# Patient Record
Sex: Male | Born: 1950 | Race: White | Hispanic: No | Marital: Married | Smoking: Never smoker
Health system: Southern US, Community
[De-identification: ages and names within clinical notes are randomized; demographics above are authoritative.]

## PROBLEM LIST (undated history)

## (undated) DIAGNOSIS — L570 Actinic keratosis: Secondary | ICD-10-CM

---
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
## 2009-12-11 ENCOUNTER — Ambulatory Visit: Payer: Self-pay | Admitting: Gastroenterology

## 2010-03-27 ENCOUNTER — Ambulatory Visit: Payer: Self-pay | Admitting: Gastroenterology

## 2010-03-29 LAB — PATHOLOGY REPORT

## 2011-10-18 IMAGING — RF DG BARIUM SWALLOW
1 series · 15 of 24 positions shown · non-contrast
Comparison: none

REASON FOR EXAM: WITH TABLET DYSPAHGIA
COMMENTS:

PROCEDURE:     FL  - FL BARIUM SWALLOW  - December 11, 2009  [DATE]
RESULT:
TECHNIQUE: Barium swallow exam was performed status post administration of
oral barium followed by administration of effervescent crystals and
re-administration of oral barium.

[Series 1: run · 14 acquisitions, 15 frames shown]
[im 1/14]
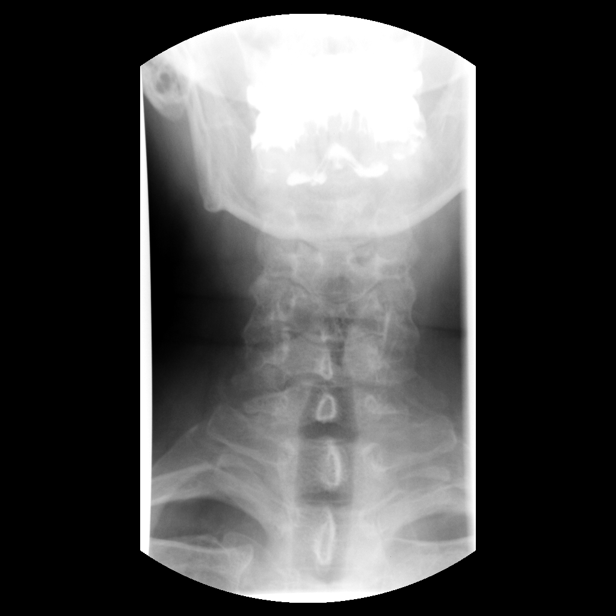
[im 1/14]
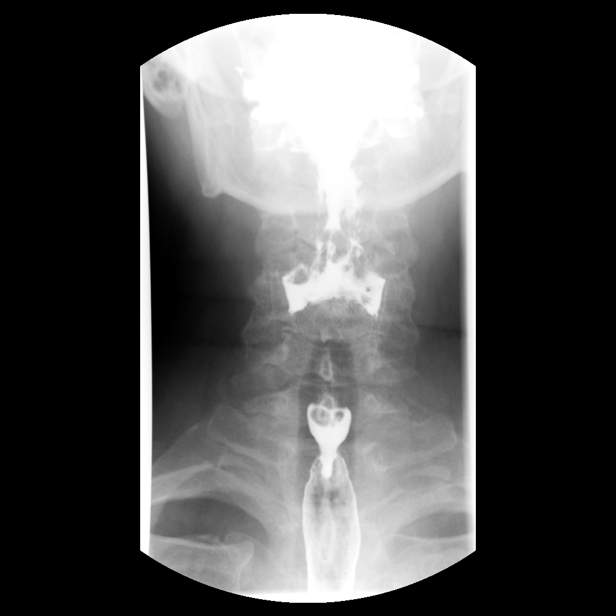
[im 2/14]
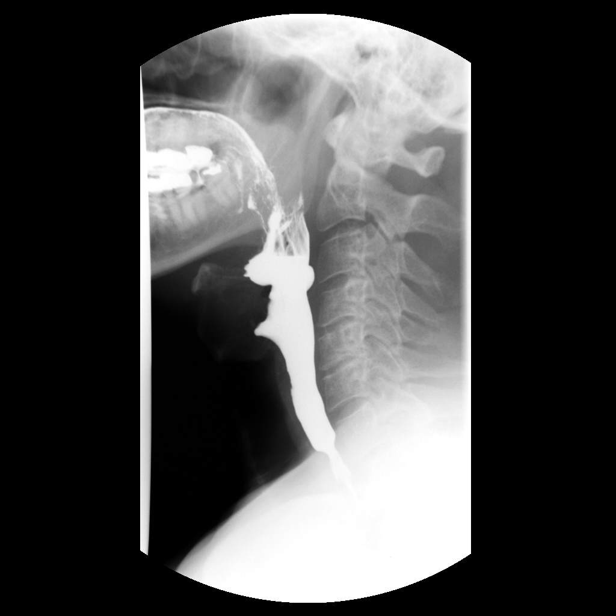
[im 2/14]
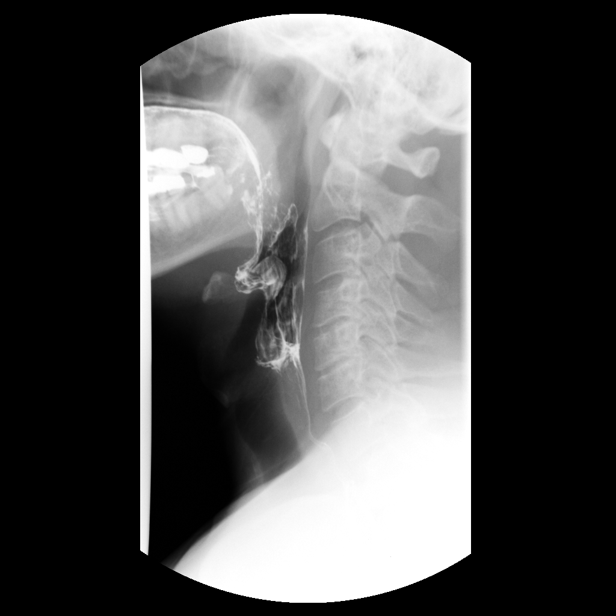
[im 3/14]
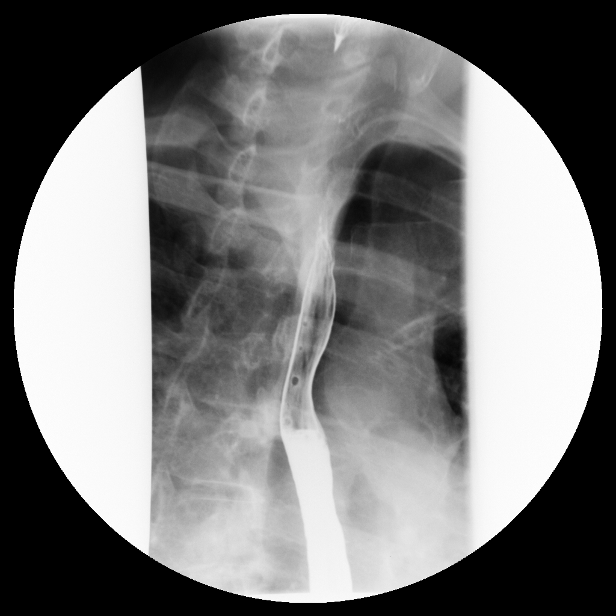
[im 3/14]
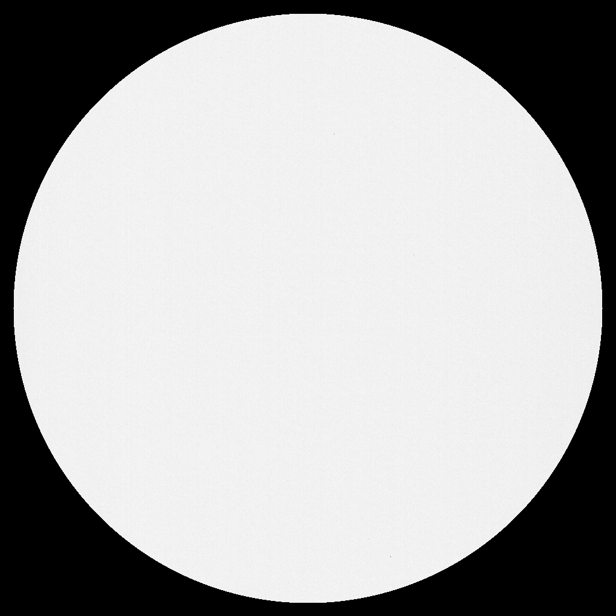
[im 4/14]
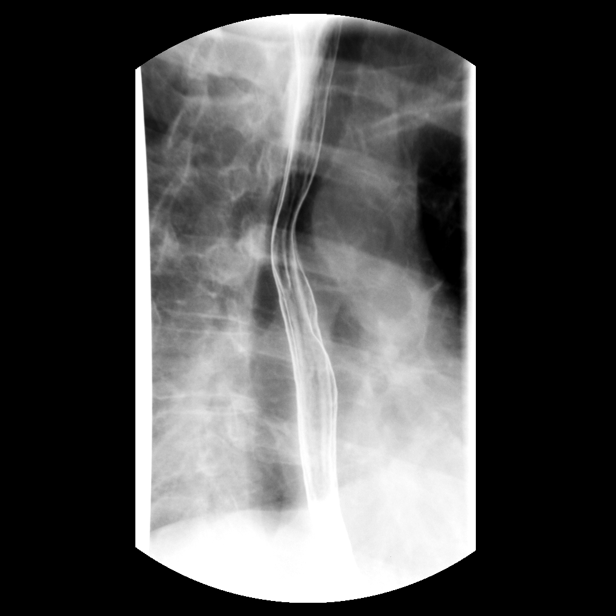
[im 5/14]
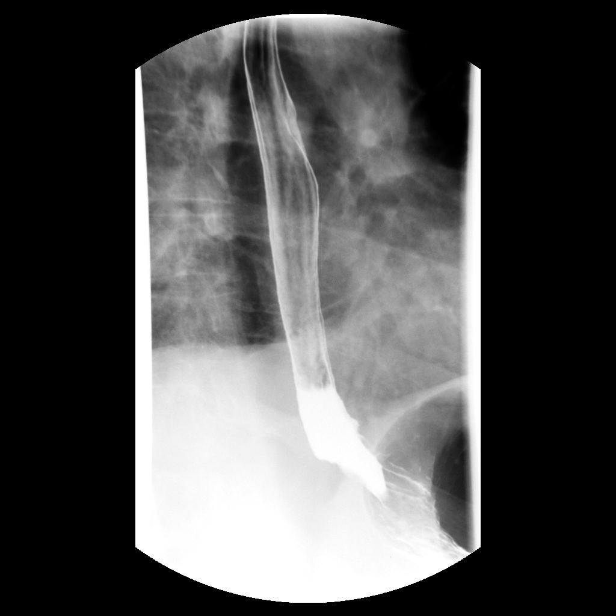
[im 6/14]
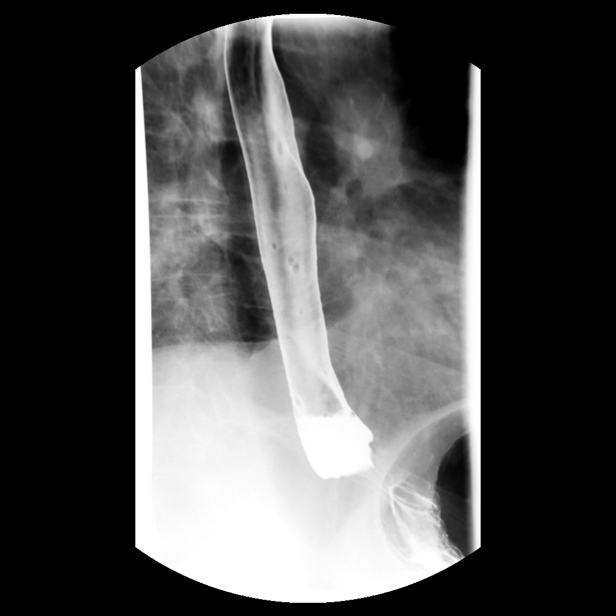
[im 7/14]
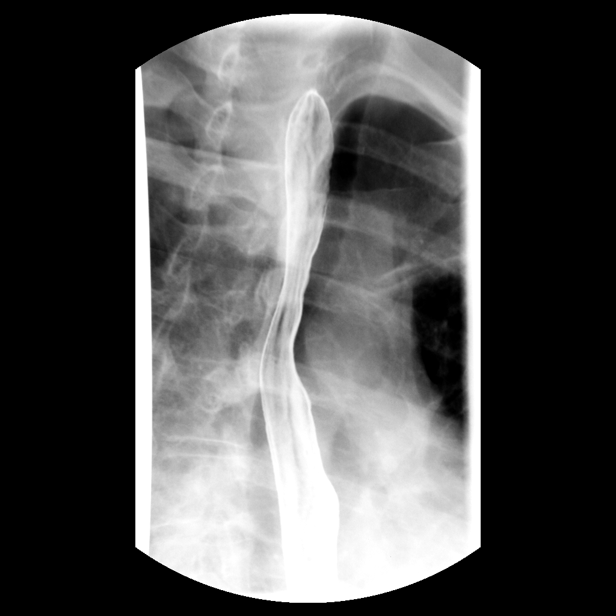
[im 7/14]
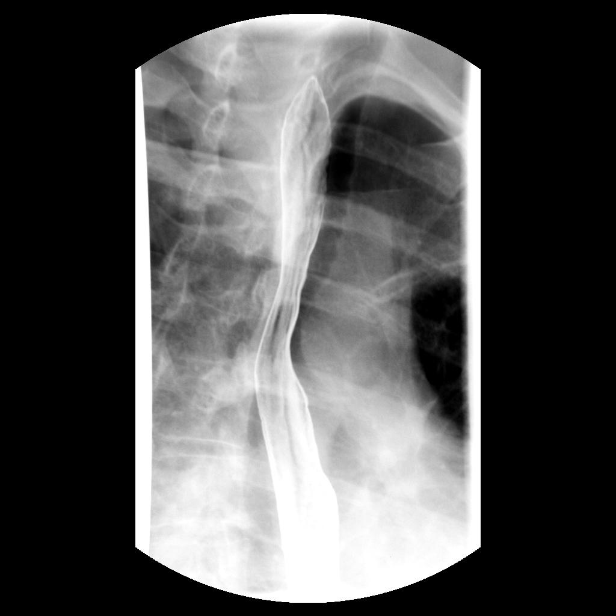
[im 8/14]
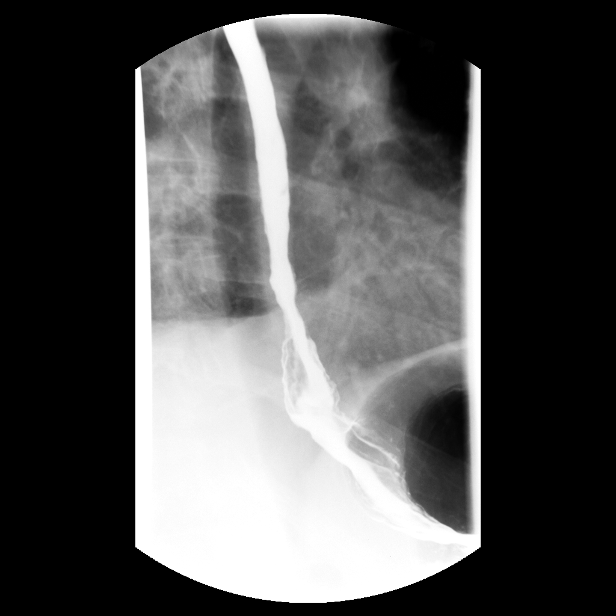
[im 11/14]
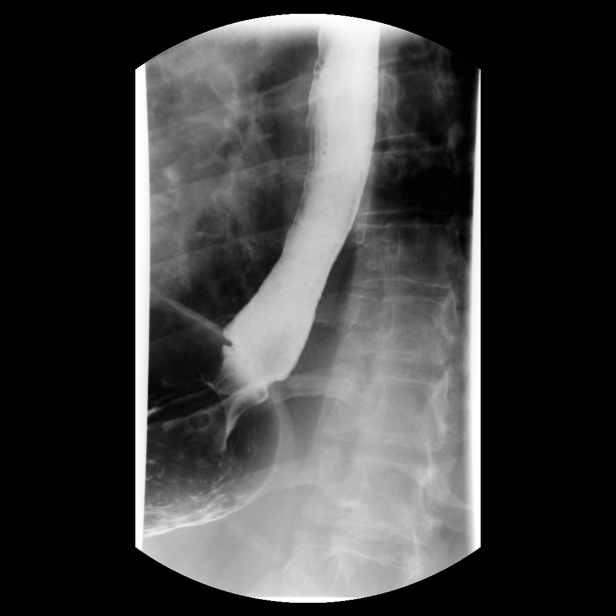
[im 12/14]
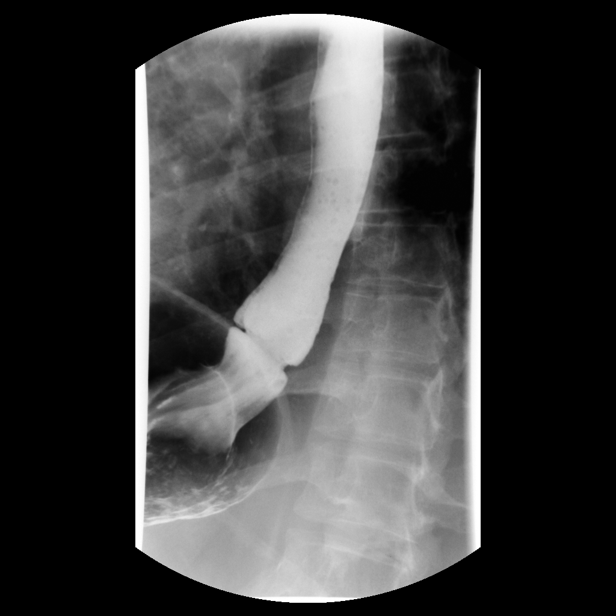
[im 14/14]
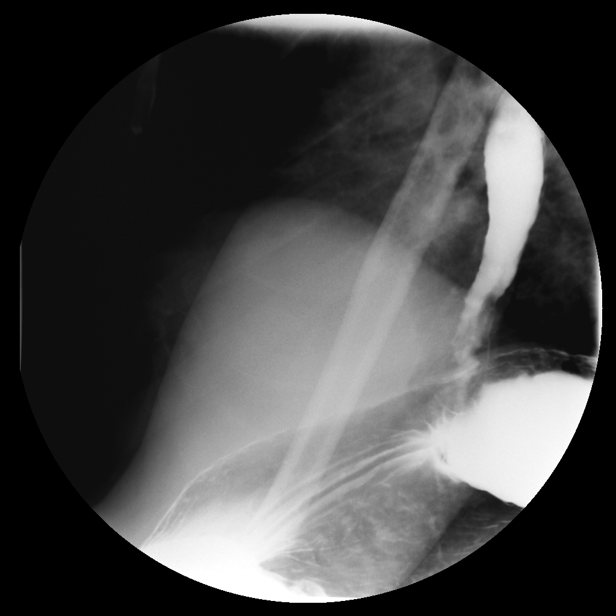

[15 of 24 positions shown; findings below may reference images not displayed]

FINDINGS: Dynamic imaging of the cervical esophagus demonstrates no
abnormalities. The thoracic esophagus is unremarkable without radiographic
abnormalities. There is appropriate relaxation of the lower esophageal
sphincter. Evaluation of a single swallow demonstrates mild to and fro
motion during swallowing with a mild decrease in the primary stripping wave.
Gastroesophageal reflux was elicited to the mid esophagus. There is no
evidence of a hiatal hernia. A 12.5 ml Meyer was administered which
transversed the esophagus without complications and emptied into the stomach.
IMPRESSION: 1.     Minimal to mild esophageal dysmotility.
2.     Moderate gastroesophageal reflux.

## 2013-08-10 DIAGNOSIS — E785 Hyperlipidemia, unspecified: Secondary | ICD-10-CM | POA: Insufficient documentation

## 2015-02-13 DIAGNOSIS — K219 Gastro-esophageal reflux disease without esophagitis: Secondary | ICD-10-CM | POA: Insufficient documentation

## 2016-09-16 DIAGNOSIS — L570 Actinic keratosis: Secondary | ICD-10-CM

## 2016-09-16 HISTORY — DX: Actinic keratosis: L57.0

## 2016-11-11 ENCOUNTER — Ambulatory Visit: Payer: Self-pay | Admitting: Podiatry

## 2017-01-06 ENCOUNTER — Ambulatory Visit (INDEPENDENT_AMBULATORY_CARE_PROVIDER_SITE_OTHER): Payer: Medicare Other | Admitting: Podiatry

## 2017-01-06 ENCOUNTER — Ambulatory Visit (INDEPENDENT_AMBULATORY_CARE_PROVIDER_SITE_OTHER): Payer: Medicare Other

## 2017-01-06 ENCOUNTER — Encounter: Payer: Self-pay | Admitting: Podiatry

## 2017-01-06 VITALS — BP 115/72 | HR 86 | Resp 16

## 2017-01-06 DIAGNOSIS — M7751 Other enthesopathy of right foot: Secondary | ICD-10-CM

## 2017-01-06 DIAGNOSIS — M19071 Primary osteoarthritis, right ankle and foot: Secondary | ICD-10-CM

## 2017-01-06 DIAGNOSIS — M778 Other enthesopathies, not elsewhere classified: Secondary | ICD-10-CM

## 2017-01-06 DIAGNOSIS — M779 Enthesopathy, unspecified: Principal | ICD-10-CM

## 2017-01-06 MED ORDER — MELOXICAM 15 MG PO TABS: 15.0000 mg | ORAL_TABLET | Freq: Every day | ORAL | 3 refills | Status: DC

## 2017-01-06 NOTE — Progress Notes (Signed)
  Subjective:  Patient ID: Kirk Villanueva, male    DOB: 1950/08/20,  MRN: [id number] HPI Chief Complaint  Patient presents with  . Foot Pain    Dorsal midfoot right - aching for about 3-4 months, no injury, no swelling or discoloration, advil does help some, also notices some right heel pain too    66 y.o. male presents with the above complaint.      No past medical history on file. No past surgical history on file.  Current Outpatient Prescriptions:  .  omeprazole (PRILOSEC) 20 MG capsule, , Disp: , Rfl: 0 .  simvastatin (ZOCOR) 20 MG tablet, , Disp: , Rfl: 0 .  tamsulosin (FLOMAX) 0.4 MG CAPS capsule, , Disp: , Rfl: 0  No Known Allergies Review of Systems  All other systems reviewed and are negative.  Objective:   Vitals:   01/06/17 0834  BP: 115/72  Pulse: 86  Resp: 16    General: Well developed, nourished, in no acute distress, alert and oriented x3   Dermatological: Skin is warm, dry and supple bilateral. Nails x 10 are well maintained; remaining integument appears unremarkable at this time. There are no open sores, no preulcerative lesions, no rash or signs of infection present.  Vascular: Dorsalis Pedis artery and Posterior Tibial artery pedal pulses are 2/4 bilateral with immedate capillary fill time. Pedal hair growth present. No varicosities and no lower extremity edema present bilateral.   Neruologic: Grossly intact via light touch bilateral. Vibratory intact via tuning fork bilateral. Protective threshold with Semmes Wienstein monofilament intact to all pedal sites bilateral. Patellar and Achilles deep tendon reflexes 2+ bilateral. No Babinski or clonus noted bilateral.   Musculoskeletal: No gross boney pedal deformities bilateral. No pain, crepitus, or limitation noted with foot and ankle range of motion bilateral. Muscular strength 5/5 in all groups tested bilateral.. He has pain on palpation of the talonavicular joint right foot.  Gait: Unassisted,  Nonantalgic.    Radiographs:  Demonstrate joint space narrowing of the talonavicular joint with dorsal spurring  Assessment & Plan:   Assessment: talonavicular joint osteoarthritic changes.  Plan: injected the area today with Kenalog and local anesthetic.started him on meloxicam 15 mg 1 by mouth daily. Follow-up with him in one month may need to consider orthotics.     Max T. [place], [place]

## 2017-02-03 ENCOUNTER — Ambulatory Visit: Payer: Medicare Other | Admitting: Podiatry

## 2017-07-31 ENCOUNTER — Other Ambulatory Visit: Payer: Self-pay

## 2017-07-31 MED ORDER — MELOXICAM 15 MG PO TABS: 15.0000 mg | ORAL_TABLET | Freq: Every day | ORAL | 0 refills | Status: DC

## 2017-07-31 NOTE — Telephone Encounter (Signed)
Pharmacy refill request for 90 day supply of Meloxicam.  Per Dr. Milinda Pointer, ok to refill    Script has been sent to pharmacy

## 2017-08-13 ENCOUNTER — Ambulatory Visit: Payer: Medicare Other | Admitting: Podiatry

## 2017-08-13 ENCOUNTER — Encounter: Payer: Self-pay | Admitting: Podiatry

## 2017-08-13 DIAGNOSIS — M779 Enthesopathy, unspecified: Secondary | ICD-10-CM | POA: Diagnosis not present

## 2017-08-13 DIAGNOSIS — M19071 Primary osteoarthritis, right ankle and foot: Secondary | ICD-10-CM | POA: Diagnosis not present

## 2017-08-13 DIAGNOSIS — M778 Other enthesopathies, not elsewhere classified: Secondary | ICD-10-CM

## 2017-08-13 NOTE — Progress Notes (Signed)
He presents today for follow-up of capsulitis to the medial aspect of the right foot along the navicular and talonavicular joint where he has osteoarthritis.  He is going on a trip to [place] and [place] on a river boat and would like to be able to enjoy it he states.  He states that he is not taking his meloxicam regularly.  Objective: Vital signs are stable he is alert and oriented x3 pulses are palpable to the right foot.  Still has pain on palpation of the talonavicular joint.  Assessment capsulitis osteoarthritis talonavicular joint.  Plan: At this point I reinjected the area with Kenalog and local anesthetic 20 mg Kenalog 5 mg local anesthetic after sterile Betadine skin prep medial aspect of the foot.  Tolerated procedure well and I will follow-up with him in the near future.

## 2018-01-27 ENCOUNTER — Other Ambulatory Visit: Payer: Self-pay

## 2018-01-27 MED ORDER — MELOXICAM 15 MG PO TABS: 15.0000 mg | ORAL_TABLET | Freq: Every day | ORAL | 1 refills | Status: DC

## 2018-01-27 NOTE — Telephone Encounter (Signed)
Pharmacy refill request for Meloxicam.  Per Dr.Hyatt, ok to refill.  Script has been sent to pharmacy

## 2018-08-03 ENCOUNTER — Other Ambulatory Visit: Payer: Self-pay

## 2018-08-03 MED ORDER — MELOXICAM 15 MG PO TABS: 15.0000 mg | ORAL_TABLET | Freq: Every day | ORAL | 0 refills | Status: DC

## 2018-08-03 NOTE — Telephone Encounter (Signed)
Patient called requesting a refill for #90 day Meloxicam.  I informed him that he has not been seen in 1 year and he will need to follow up with Dr. Milinda Pointer for any further refills.  He verbalized understanding and Per Dr. Stephenie Acres verbal order, ok to give 90 day supply.   Script has been sent to his pharmacy

## 2018-10-05 ENCOUNTER — Ambulatory Visit: Payer: Medicare Other | Admitting: Podiatry

## 2018-10-19 ENCOUNTER — Other Ambulatory Visit: Payer: Self-pay

## 2018-10-19 ENCOUNTER — Ambulatory Visit: Payer: Medicare Other | Admitting: Podiatry

## 2018-10-19 ENCOUNTER — Encounter: Payer: Self-pay | Admitting: Podiatry

## 2018-10-19 VITALS — Temp 97.2°F

## 2018-10-19 DIAGNOSIS — M779 Enthesopathy, unspecified: Secondary | ICD-10-CM

## 2018-10-19 DIAGNOSIS — M778 Other enthesopathies, not elsewhere classified: Secondary | ICD-10-CM

## 2018-10-19 DIAGNOSIS — M19071 Primary osteoarthritis, right ankle and foot: Secondary | ICD-10-CM

## 2018-10-19 MED ORDER — MELOXICAM 15 MG PO TABS: 15.0000 mg | ORAL_TABLET | Freq: Every day | ORAL | 3 refills | Status: AC

## 2018-10-19 NOTE — Progress Notes (Signed)
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  States that everything is doing great I does need a refill on meloxicam.  States that he recently had a physical performed by his primary care provider stating that his liver and his kidney function were doing great.  Objective: Vital signs are stable he is alert oriented x3 pulses are palpable.  Has nonreproducible pain on palpation of the right foot.  Assessment: Osteoarthritis capsulitis talonavicular joint right foot.  Plan: Dispensed meloxicam No. 90 15 mg 1 p.o. daily with 3 refills

## 2019-04-19 ENCOUNTER — Ambulatory Visit: Payer: Medicare PPO | Attending: Internal Medicine

## 2019-08-04 ENCOUNTER — Other Ambulatory Visit: Payer: Self-pay

## 2019-08-04 ENCOUNTER — Ambulatory Visit: Payer: Medicare PPO | Admitting: Dermatology

## 2019-08-04 DIAGNOSIS — M67449 Ganglion, unspecified hand: Secondary | ICD-10-CM

## 2019-08-04 DIAGNOSIS — M71342 Other bursal cyst, left hand: Secondary | ICD-10-CM | POA: Diagnosis not present

## 2019-08-04 DIAGNOSIS — L820 Inflamed seborrheic keratosis: Secondary | ICD-10-CM

## 2019-08-04 NOTE — Progress Notes (Signed)
   Follow-Up Visit   Subjective  Kirk Villanueva is a 69 y.o. male who presents for the following: Follow-up.  Patient here today for a spot on left index finger. Has been present for at least 1 year, was almost gone but has come back. It has gotten bigger and bothers patient if he hits it. He also had an ISK treated about 10 weeks ago at the right ankle and would like to have that rechecked as well.   The following portions of the chart were reviewed this encounter and updated as appropriate:     Review of Systems:  No other skin or systemic complaints except as noted in HPI or Assessment and Plan.  Objective  Well appearing patient in no apparent distress; mood and affect are within normal limits.  A focused examination was performed including right lower leg, left hand and fingers. Relevant physical exam findings are noted in the Assessment and Plan.  Objective  Left Index Finger Proximal Nail Fold: Solitary, smooth skin colored to translucent papule. 79mm  Objective  Right lateral ankle: Residual erythema and mild scale, treated previously with Ln2- improved   Assessment & Plan  Digital mucous cyst Left Index Finger Proximal Nail Fold  Benign, observe.  Discussed shave removal, but would likely recur. Recommend evaluation by hand surgeon for removal if symptomatic.    Inflamed seborrheic keratosis Right lateral ankle  Resolved post cryotherapy Benign, observe.    Return for TBSE as scheduled with Dr. Tammi Sou, RMA, am acting as scribe for Brendolyn Patty, MD .  Documentation: I have reviewed the above documentation for accuracy and completeness, and I agree with the above.  Brendolyn Patty MD

## 2019-08-04 NOTE — Patient Instructions (Signed)
Recommend daily broad spectrum sunscreen SPF 30+ to sun-exposed areas, reapply every 2 hours as needed. Call for new or changing lesions.  

## 2020-03-02 DIAGNOSIS — N401 Enlarged prostate with lower urinary tract symptoms: Secondary | ICD-10-CM | POA: Insufficient documentation

## 2020-03-02 DIAGNOSIS — N1831 Chronic kidney disease, stage 3a: Secondary | ICD-10-CM | POA: Insufficient documentation

## 2020-05-17 ENCOUNTER — Ambulatory Visit: Payer: Medicare PPO | Admitting: Dermatology

## 2020-05-17 ENCOUNTER — Other Ambulatory Visit: Payer: Self-pay

## 2020-05-17 ENCOUNTER — Encounter: Payer: Self-pay | Admitting: Dermatology

## 2020-05-17 DIAGNOSIS — D1800 Hemangioma unspecified site: Secondary | ICD-10-CM

## 2020-05-17 DIAGNOSIS — L814 Other melanin hyperpigmentation: Secondary | ICD-10-CM | POA: Diagnosis not present

## 2020-05-17 DIAGNOSIS — L821 Other seborrheic keratosis: Secondary | ICD-10-CM | POA: Diagnosis not present

## 2020-05-17 DIAGNOSIS — L820 Inflamed seborrheic keratosis: Secondary | ICD-10-CM | POA: Diagnosis not present

## 2020-05-17 DIAGNOSIS — D229 Melanocytic nevi, unspecified: Secondary | ICD-10-CM

## 2020-05-17 DIAGNOSIS — B079 Viral wart, unspecified: Secondary | ICD-10-CM | POA: Diagnosis not present

## 2020-05-17 DIAGNOSIS — Z1283 Encounter for screening for malignant neoplasm of skin: Secondary | ICD-10-CM | POA: Diagnosis not present

## 2020-05-17 DIAGNOSIS — L578 Other skin changes due to chronic exposure to nonionizing radiation: Secondary | ICD-10-CM | POA: Diagnosis not present

## 2020-05-17 DIAGNOSIS — L570 Actinic keratosis: Secondary | ICD-10-CM

## 2020-05-17 NOTE — Progress Notes (Signed)
Follow-Up Visit   Subjective  Kirk Villanueva is a 70 y.o. male who presents for the following: Warts (R index finger, ~42m, compound w) and Total body skin exam (Hx of Aks). The patient presents for Total-Body Skin Exam (TBSE) for skin cancer screening and mole check.  The following portions of the chart were reviewed this encounter and updated as appropriate:   Tobacco  Allergies  Meds  Problems  Med Hx  Surg Hx  Fam Hx     Review of Systems:  No other skin or systemic complaints except as noted in HPI or Assessment and Plan.  Objective  Well appearing patient in no apparent distress; mood and affect are within normal limits.  A full examination was performed including scalp, head, eyes, ears, nose, lips, neck, chest, axillae, abdomen, back, buttocks, bilateral upper extremities, bilateral lower extremities, hands, feet, fingers, toes, fingernails, and toenails. All findings within normal limits unless otherwise noted below.  Objective  Right index finger x 1: Verrucous papules -- Discussed viral etiology and contagion.   Objective  Scalp x 5 (5): Pink scaly macules   Objective  Left Popliteal Fossa x 2 (2): Erythematous keratotic or waxy stuck-on papule or plaque.    Assessment & Plan    Lentigines - Scattered tan macules - Due to sun exposure - Benign-appering, observe - Recommend daily broad spectrum sunscreen SPF 30+ to sun-exposed areas, reapply every 2 hours as needed. - Call for any changes  Seborrheic Keratoses - Stuck-on, waxy, tan-brown papules and plaques  - Discussed benign etiology and prognosis. - Observe - Call for any changes  Melanocytic Nevi - Tan-brown and/or pink-flesh-colored symmetric macules and papules - Benign appearing on exam today - Observation - Call clinic for new or changing moles - Recommend daily use of broad spectrum spf 30+ sunscreen to sun-exposed areas.   Hemangiomas - Red papules - Discussed benign nature -  Observe - Call for any changes  Actinic Damage - Chronic, secondary to cumulative UV/sun exposure - diffuse scaly erythematous macules with underlying dyspigmentation - Recommend daily broad spectrum sunscreen SPF 30+ to sun-exposed areas, reapply every 2 hours as needed.  - Call for new or changing lesions.  Skin cancer screening performed today.  Viral warts, unspecified type Right index finger x 1  Cont otc Compound W qd  Destruction of lesion - Right index finger x 1 Complexity: simple   Destruction method: cryotherapy   Informed consent: discussed and consent obtained   Timeout:  patient name, date of birth, surgical site, and procedure verified Lesion destroyed using liquid nitrogen: Yes   Region frozen until ice ball extended beyond lesion: Yes   Outcome: patient tolerated procedure well with no complications   Post-procedure details: wound care instructions given    AK (actinic keratosis) (5) Scalp x 5  Destruction of lesion - Scalp x 5 Complexity: simple   Destruction method: cryotherapy   Informed consent: discussed and consent obtained   Timeout:  patient name, date of birth, surgical site, and procedure verified Lesion destroyed using liquid nitrogen: Yes   Region frozen until ice ball extended beyond lesion: Yes   Outcome: patient tolerated procedure well with no complications   Post-procedure details: wound care instructions given    Inflamed seborrheic keratosis (2) Left Popliteal Fossa x 2  Destruction of lesion - Left Popliteal Fossa x 2 Complexity: simple   Destruction method: cryotherapy   Informed consent: discussed and consent obtained   Timeout:  patient name, date  of birth, surgical site, and procedure verified Lesion destroyed using liquid nitrogen: Yes   Region frozen until ice ball extended beyond lesion: Yes   Outcome: patient tolerated procedure well with no complications   Post-procedure details: wound care instructions given    Return  in about 1 year (around 05/17/2021) for TBSE, Hx of AKs.  I, Othelia Pulling, RMA, am acting as scribe for Sarina Ser, MD .  Documentation: I have reviewed the above documentation for accuracy and completeness, and I agree with the above.  Sarina Ser, MD

## 2020-07-06 ENCOUNTER — Other Ambulatory Visit: Payer: Self-pay

## 2020-07-06 ENCOUNTER — Ambulatory Visit: Payer: Medicare PPO | Admitting: Dermatology

## 2020-07-06 DIAGNOSIS — L578 Other skin changes due to chronic exposure to nonionizing radiation: Secondary | ICD-10-CM

## 2020-07-06 DIAGNOSIS — B079 Viral wart, unspecified: Secondary | ICD-10-CM

## 2020-07-06 DIAGNOSIS — L570 Actinic keratosis: Secondary | ICD-10-CM | POA: Diagnosis not present

## 2020-07-06 NOTE — Progress Notes (Signed)
   Follow-Up Visit   Subjective  Kirk Villanueva is a 70 y.o. male who presents for the following: Actinic Keratosis (Follow up of scalp treated with LN2 x 5) and Warts (Follow up of right index finger treated with LN2).  The following portions of the chart were reviewed this encounter and updated as appropriate:   Tobacco  Allergies  Meds  Problems  Med Hx  Surg Hx  Fam Hx     Review of Systems:  No other skin or systemic complaints except as noted in HPI or Assessment and Plan.  Objective  Well appearing patient in no apparent distress; mood and affect are within normal limits.  A focused examination was performed including face, scalp, right hand. Relevant physical exam findings are noted in the Assessment and Plan.  Objective  Scalp (3): Erythematous thin papules/macules with gritty scale.   Objective  Right index finger: Verrucous papules -- Discussed viral etiology and contagion.    Assessment & Plan    Actinic Damage - chronic, secondary to cumulative UV radiation exposure/sun exposure over time - diffuse scaly erythematous macules with underlying dyspigmentation - Recommend daily broad spectrum sunscreen SPF 30+ to sun-exposed areas, reapply every 2 hours as needed.  - Recommend staying in the shade or wearing long sleeves, sun glasses (UVA+UVB protection) and wide brim hats (4-inch brim around the entire circumference of the hat). - Call for new or changing lesions.  AK (actinic keratosis) (3) Scalp  Destruction of lesion - Scalp Complexity: simple   Destruction method: cryotherapy   Informed consent: discussed and consent obtained   Timeout:  patient name, date of birth, surgical site, and procedure verified Lesion destroyed using liquid nitrogen: Yes   Region frozen until ice ball extended beyond lesion: Yes   Outcome: patient tolerated procedure well with no complications   Post-procedure details: wound care instructions given    Viral warts,  unspecified type Right index finger  Destruction of lesion - Right index finger Complexity: simple   Destruction method: cryotherapy   Informed consent: discussed and consent obtained   Timeout:  patient name, date of birth, surgical site, and procedure verified Lesion destroyed using liquid nitrogen: Yes   Region frozen until ice ball extended beyond lesion: Yes   Outcome: patient tolerated procedure well with no complications   Post-procedure details: wound care instructions given    Return in about 1 year (around 07/06/2021).   I, Ashok Cordia, CMA, am acting as scribe for Sarina Ser, MD .  Documentation: I have reviewed the above documentation for accuracy and completeness, and I agree with the above.  Sarina Ser, MD

## 2020-07-06 NOTE — Patient Instructions (Signed)

## 2020-07-10 ENCOUNTER — Encounter: Payer: Self-pay | Admitting: Dermatology

## 2021-05-23 ENCOUNTER — Ambulatory Visit: Payer: Medicare PPO | Admitting: Dermatology

## 2021-05-23 ENCOUNTER — Other Ambulatory Visit: Payer: Self-pay

## 2021-05-23 DIAGNOSIS — Z872 Personal history of diseases of the skin and subcutaneous tissue: Secondary | ICD-10-CM | POA: Diagnosis not present

## 2021-05-23 DIAGNOSIS — L820 Inflamed seborrheic keratosis: Secondary | ICD-10-CM | POA: Diagnosis not present

## 2021-05-23 DIAGNOSIS — L578 Other skin changes due to chronic exposure to nonionizing radiation: Secondary | ICD-10-CM

## 2021-05-23 DIAGNOSIS — Z1283 Encounter for screening for malignant neoplasm of skin: Secondary | ICD-10-CM

## 2021-05-23 DIAGNOSIS — L814 Other melanin hyperpigmentation: Secondary | ICD-10-CM

## 2021-05-23 DIAGNOSIS — D1800 Hemangioma unspecified site: Secondary | ICD-10-CM

## 2021-05-23 DIAGNOSIS — L821 Other seborrheic keratosis: Secondary | ICD-10-CM

## 2021-05-23 DIAGNOSIS — D229 Melanocytic nevi, unspecified: Secondary | ICD-10-CM

## 2021-05-23 NOTE — Progress Notes (Signed)
? ?Follow-Up Visit ?  ?Subjective  ?Kirk Villanueva is a 71 y.o. male who presents for the following: Annual Exam (Mole check). Hx of Aks. ?The patient presents for Total-Body Skin Exam (TBSE) for skin cancer screening and mole check.  The patient has spots, moles and lesions to be evaluated, some may be new or changing and the patient has concerns that these could be cancer.  ? ?The following portions of the chart were reviewed this encounter and updated as appropriate:  ? Tobacco  Allergies  Meds  Problems  Med Hx  Surg Hx  Fam Hx   ?  ?Review of Systems:  No other skin or systemic complaints except as noted in HPI or Assessment and Plan. ? ?Objective  ?Well appearing patient in no apparent distress; mood and affect are within normal limits. ? ?A full examination was performed including scalp, head, eyes, ears, nose, lips, neck, chest, axillae, abdomen, back, buttocks, bilateral upper extremities, bilateral lower extremities, hands, feet, fingers, toes, fingernails, and toenails. All findings within normal limits unless otherwise noted below. ? ?Scalp x 1, left calf x 1  (2) (2) ?Stuck-on, waxy, tan-brown papules and plaques -- Discussed benign etiology and prognosis.  ? ?scalp ?Clear skin ? ? ?Assessment & Plan  ?Inflamed seborrheic keratosis (2) ?Scalp x 1, left calf x 1  (2) ? ?Reassured benign age-related growth.  Recommend observation.  Discussed cryotherapy if spot(s) become irritated or inflamed.  ? ?Destruction of lesion - Scalp x 1, left calf x 1  (2) ?Complexity: simple   ?Destruction method: cryotherapy   ?Informed consent: discussed and consent obtained   ?Timeout:  patient name, date of birth, surgical site, and procedure verified ?Lesion destroyed using liquid nitrogen: Yes   ?Region frozen until ice ball extended beyond lesion: Yes   ?Outcome: patient tolerated procedure well with no complications   ?Post-procedure details: wound care instructions given   ? ?Hx of actinic  keratosis ?Scalp ?Clear today ?Actinic keratoses are precancerous spots that appear secondary to cumulative UV radiation exposure/sun exposure over time. They are chronic with expected duration over 1 year. A portion of actinic keratoses will progress to squamous cell carcinoma of the skin. It is not possible to reliably predict which spots will progress to skin cancer and so treatment is recommended to prevent development of skin cancer. ? ?Recommend daily broad spectrum sunscreen SPF 30+ to sun-exposed areas, reapply every 2 hours as needed.  ?Recommend staying in the shade or wearing long sleeves, sun glasses (UVA+UVB protection) and wide brim hats (4-inch brim around the entire circumference of the hat). ?Call for new or changing lesions.  ? ?Skin cancer screening ? ?Lentigines ?- Scattered tan macules ?- Due to sun exposure ?- Benign-appearing, observe ?- Recommend daily broad spectrum sunscreen SPF 30+ to sun-exposed areas, reapply every 2 hours as needed. ?- Call for any changes ? ?Seborrheic Keratoses ?- Stuck-on, waxy, tan-brown papules and/or plaques  ?- Benign-appearing ?- Discussed benign etiology and prognosis. ?- Observe ?- Call for any changes ? ?Melanocytic Nevi ?- Tan-brown and/or pink-flesh-colored symmetric macules and papules ?- Benign appearing on exam today ?- Observation ?- Call clinic for new or changing moles ?- Recommend daily use of broad spectrum spf 30+ sunscreen to sun-exposed areas.  ? ?Hemangiomas ?- Red papules ?- Discussed benign nature ?- Observe ?- Call for any changes ? ?Actinic Damage ?- Chronic condition, secondary to cumulative UV/sun exposure ?- diffuse scaly erythematous macules with underlying dyspigmentation ?- Recommend daily broad spectrum  sunscreen SPF 30+ to sun-exposed areas, reapply every 2 hours as needed.  ?- Staying in the shade or wearing long sleeves, sun glasses (UVA+UVB protection) and wide brim hats (4-inch brim around the entire circumference of the hat) are  also recommended for sun protection.  ?- Call for new or changing lesions. ? ?Skin cancer screening performed today.  ? ?Return in about 1 year (around 05/24/2022) for TBSE, hx of AKs. ? ?I, Marye Round, CMA, am acting as scribe for Sarina Ser, MD .  ?Documentation: I have reviewed the above documentation for accuracy and completeness, and I agree with the above. ? ?Sarina Ser, MD ? ?

## 2021-05-23 NOTE — Patient Instructions (Addendum)

## 2021-05-27 ENCOUNTER — Encounter: Payer: Self-pay | Admitting: Dermatology

## 2022-04-02 ENCOUNTER — Other Ambulatory Visit: Payer: Self-pay | Admitting: Urology

## 2022-04-02 DIAGNOSIS — N138 Other obstructive and reflux uropathy: Secondary | ICD-10-CM

## 2022-04-02 DIAGNOSIS — R350 Frequency of micturition: Secondary | ICD-10-CM

## 2022-04-02 DIAGNOSIS — R972 Elevated prostate specific antigen [PSA]: Secondary | ICD-10-CM

## 2022-05-08 ENCOUNTER — Ambulatory Visit
Admission: RE | Admit: 2022-05-08 | Discharge: 2022-05-08 | Disposition: A | Discharge status: Home / Self Care | Payer: Medicare PPO | Source: Ambulatory Visit | Attending: Urology | Admitting: Urology

## 2022-05-08 DIAGNOSIS — R350 Frequency of micturition: Secondary | ICD-10-CM

## 2022-05-08 DIAGNOSIS — N138 Other obstructive and reflux uropathy: Secondary | ICD-10-CM

## 2022-05-08 DIAGNOSIS — R972 Elevated prostate specific antigen [PSA]: Secondary | ICD-10-CM

## 2022-05-08 MED ORDER — GADOPICLENOL 0.5 MMOL/ML IV SOLN
10.0000 mL | Freq: Once | INTRAVENOUS | Status: AC | PRN
  Administered 2022-05-08: 10 mL via INTRAVENOUS

## 2022-05-29 ENCOUNTER — Ambulatory Visit: Payer: Medicare PPO | Admitting: Dermatology

## 2022-05-29 VITALS — BP 111/74 | HR 101

## 2022-05-29 DIAGNOSIS — D229 Melanocytic nevi, unspecified: Secondary | ICD-10-CM

## 2022-05-29 DIAGNOSIS — Q825 Congenital non-neoplastic nevus: Secondary | ICD-10-CM

## 2022-05-29 DIAGNOSIS — D1801 Hemangioma of skin and subcutaneous tissue: Secondary | ICD-10-CM

## 2022-05-29 DIAGNOSIS — D225 Melanocytic nevi of trunk: Secondary | ICD-10-CM | POA: Diagnosis not present

## 2022-05-29 DIAGNOSIS — Z1283 Encounter for screening for malignant neoplasm of skin: Secondary | ICD-10-CM | POA: Diagnosis not present

## 2022-05-29 DIAGNOSIS — L821 Other seborrheic keratosis: Secondary | ICD-10-CM

## 2022-05-29 DIAGNOSIS — L570 Actinic keratosis: Secondary | ICD-10-CM | POA: Diagnosis not present

## 2022-05-29 DIAGNOSIS — L814 Other melanin hyperpigmentation: Secondary | ICD-10-CM

## 2022-05-29 DIAGNOSIS — L578 Other skin changes due to chronic exposure to nonionizing radiation: Secondary | ICD-10-CM | POA: Diagnosis not present

## 2022-05-29 NOTE — Patient Instructions (Addendum)
Cryotherapy Aftercare  Wash gently with soap and water everyday.   Apply Vaseline and Band-Aid daily until healed.     Due to recent changes in healthcare laws, you may see results of your pathology and/or laboratory studies on MyChart before the doctors have had a chance to review them. We understand that in some cases there may be results that are confusing or concerning to you. Please understand that not all results are received at the same time and often the doctors may need to interpret multiple results in order to provide you with the best plan of care or course of treatment. Therefore, we ask that you please give us 2 business days to thoroughly review all your results before contacting the office for clarification. Should we see a critical lab result, you will be contacted sooner.   If You Need Anything After Your Visit  If you have any questions or concerns for your doctor, please call our main line at [phone] and press option 4 to reach your doctor's medical assistant. If no one answers, please leave a voicemail as directed and we will return your call as soon as possible. Messages left after 4 pm will be answered the following business day.   You may also send us a message via MyChart. We typically respond to MyChart messages within 1-2 business days.  For prescription refills, please ask your pharmacy to contact our office. Our fax number is [phone].  If you have an urgent issue when the clinic is closed that cannot wait until the next business day, you can page your doctor at the number below.    Please note that while we do our best to be available for urgent issues outside of office hours, we are not available 24/7.   If you have an urgent issue and are unable to reach us, you may choose to seek medical care at your doctor's office, retail clinic, urgent care center, or emergency room.  If you have a medical emergency, please immediately call 911 or go to the  emergency department.  Pager Numbers  - Dr. Kowalski: [phone]  - Dr. Moye: [phone]  - Dr. Stewart: [phone]  In the event of inclement weather, please call our main line at [phone] for an update on the status of any delays or closures.  Dermatology Medication Tips: Please keep the boxes that topical medications come in in order to help keep track of the instructions about where and how to use these. Pharmacies typically print the medication instructions only on the boxes and not directly on the medication tubes.   If your medication is too expensive, please contact our office at [phone] option 4 or send us a message through MyChart.   We are unable to tell what your co-pay for medications will be in advance as this is different depending on your insurance coverage. However, we may be able to find a substitute medication at lower cost or fill out paperwork to get insurance to cover a needed medication.   If a prior authorization is required to get your medication covered by your insurance company, please allow us 1-2 business days to complete this process.  Drug prices often vary depending on where the prescription is filled and some pharmacies may offer cheaper prices.  The website www.goodrx.com contains coupons for medications through different pharmacies. The prices here do not account for what the cost may be with help from insurance (it may be cheaper with your insurance), but the website can   give you the price if you did not use any insurance.  - You can print the associated coupon and take it with your prescription to the pharmacy.  - You may also stop by our office during regular business hours and pick up a GoodRx coupon card.  - If you need your prescription sent electronically to a different pharmacy, notify our office through [place] MyChart or by phone at [phone] option 4.     Si Usted Necesita Algo Despus de Su Visita  Tambin puede  enviarnos un mensaje a travs de MyChart. Por lo general respondemos a los mensajes de MyChart en el transcurso de 1 a 2 das hbiles.  Para renovar recetas, por favor pida a su farmacia que se ponga en contacto con nuestra oficina. Nuestro nmero de fax es el [phone].  Si tiene un asunto urgente cuando la clnica est cerrada y que no puede esperar hasta el siguiente da hbil, puede llamar/localizar a su doctor(a) al nmero que aparece a continuacin.   Por favor, tenga en cuenta que aunque hacemos todo lo posible para estar disponibles para asuntos urgentes fuera del horario de oficina, no estamos disponibles las 24 horas del da, los 7 das de la semana.   Si tiene un problema urgente y no puede comunicarse con nosotros, puede optar por buscar atencin mdica  en el consultorio de su doctor(a), en una clnica privada, en un centro de atencin urgente o en una sala de emergencias.  Si tiene una emergencia mdica, por favor llame inmediatamente al 911 o vaya a la sala de emergencias.  Nmeros de bper  - Dr. Kowalski: [phone]  - Dra. Moye: [phone]  - Dra. Stewart: [phone]  En caso de inclemencias del tiempo, por favor llame a nuestra lnea principal al [phone] para una actualizacin sobre el estado de cualquier retraso o cierre.  Consejos para la medicacin en dermatologa: Por favor, guarde las cajas en las que vienen los medicamentos de uso tpico para ayudarle a seguir las instrucciones sobre dnde y cmo usarlos. Las farmacias generalmente imprimen las instrucciones del medicamento slo en las cajas y no directamente en los tubos del medicamento.   Si su medicamento es muy caro, por favor, pngase en contacto con nuestra oficina llamando al [phone] y presione la opcin 4 o envenos un mensaje a travs de MyChart.   No podemos decirle cul ser su copago por los medicamentos por adelantado ya que esto es diferente dependiendo de la cobertura de su seguro.  Sin embargo, es posible que podamos encontrar un medicamento sustituto a menor costo o llenar un formulario para que el seguro cubra el medicamento que se considera necesario.   Si se requiere una autorizacin previa para que su compaa de seguros cubra su medicamento, por favor permtanos de 1 a 2 das hbiles para completar este proceso.  Los precios de los medicamentos varan con frecuencia dependiendo del lugar de dnde se surte la receta y alguna farmacias pueden ofrecer precios ms baratos.  El sitio web www.goodrx.com tiene cupones para medicamentos de diferentes farmacias. Los precios aqu no tienen en cuenta lo que podra costar con la ayuda del seguro (puede ser ms barato con su seguro), pero el sitio web puede darle el precio si no utiliz ningn seguro.  - Puede imprimir el cupn correspondiente y llevarlo con su receta a la farmacia.  - Tambin puede pasar por nuestra oficina durante el horario de atencin regular y recoger una tarjeta de cupones de GoodRx.  -   Si necesita que su receta se enve electrnicamente a una farmacia diferente, informe a nuestra oficina a travs de MyChart de [facility] o por telfono llamando al [phone] y presione la opcin 4.  

## 2022-05-29 NOTE — Progress Notes (Signed)
Follow-Up Visit   Subjective  Kirk Villanueva is a 72 y.o. male who presents for the following: Total body skin exam (Hx of aks) and check spots (Scalp, R preauricular, few months, dry, scaly). The patient presents for Total-Body Skin Exam (TBSE) for skin cancer screening and mole check.  The patient has spots, moles and lesions to be evaluated, some may be new or changing and the patient has concerns that these could be cancer.   The following portions of the chart were reviewed this encounter and updated as appropriate:   Tobacco  Allergies  Meds  Problems  Med Hx  Surg Hx  Fam Hx     Review of Systems:  No other skin or systemic complaints except as noted in HPI or Assessment and Plan.  Objective  Well appearing patient in no apparent distress; mood and affect are within normal limits.  A full examination was performed including scalp, head, eyes, ears, nose, lips, neck, chest, axillae, abdomen, back, buttocks, bilateral upper extremities, bilateral lower extremities, hands, feet, fingers, toes, fingernails, and toenails. All findings within normal limits unless otherwise noted below.  scalp x 1, L preauricular x 1 (2) Pink scaly macules  L forearm, volar aspect Light brown macule   Assessment & Plan   Lentigines - Scattered tan macules - Due to sun exposure - Benign-appearing, observe - Recommend daily broad spectrum sunscreen SPF 30+ to sun-exposed areas, reapply every 2 hours as needed. - Call for any changes - arms, back  Seborrheic Keratoses - Stuck-on, waxy, tan-brown papules and/or plaques  - Benign-appearing - Discussed benign etiology and prognosis. - Observe - Call for any changes - trunk  Melanocytic Nevi - Tan-brown and/or pink-flesh-colored symmetric macules and papules - Benign appearing on exam today - Observation - Call clinic for new or changing moles - Recommend daily use of broad spectrum spf 30+ sunscreen to sun-exposed areas.  -  trunk  Hemangiomas - Red papules - Discussed benign nature - Observe - Call for any changes - trunk  Actinic Damage - Chronic condition, secondary to cumulative UV/sun exposure - diffuse scaly erythematous macules with underlying dyspigmentation - Recommend daily broad spectrum sunscreen SPF 30+ to sun-exposed areas, reapply every 2 hours as needed.  - Staying in the shade or wearing long sleeves, sun glasses (UVA+UVB protection) and wide brim hats (4-inch brim around the entire circumference of the hat) are also recommended for sun protection.  - Call for new or changing lesions.  Skin cancer screening performed today.   AK (actinic keratosis) (2) scalp x 1, L preauricular x 1 Destruction of lesion - scalp x 1, L preauricular x 1 Complexity: simple   Destruction method: cryotherapy   Informed consent: discussed and consent obtained   Timeout:  patient name, date of birth, surgical site, and procedure verified Lesion destroyed using liquid nitrogen: Yes   Region frozen until ice ball extended beyond lesion: Yes   Outcome: patient tolerated procedure well with no complications   Post-procedure details: wound care instructions given    Congenital non-neoplastic nevus L forearm, volar aspect Benign-appearing.  Observation.  Call clinic for new or changing moles.  Recommend daily use of broad spectrum spf 30+ sunscreen to sun-exposed areas.    Return in about 1 year (around 05/29/2023) for TBSE, Hx of AKs.  I, Othelia Pulling, RMA, am acting as scribe for Sarina Ser, MD . Documentation: I have reviewed the above documentation for accuracy and completeness, and I agree with the above.  Sarina Ser, MD

## 2022-05-31 ENCOUNTER — Encounter: Payer: Self-pay | Admitting: Dermatology

## 2022-07-02 ENCOUNTER — Ambulatory Visit: Payer: Medicare PPO | Admitting: Podiatry

## 2022-07-02 ENCOUNTER — Encounter: Payer: Self-pay | Admitting: Podiatry

## 2022-07-02 DIAGNOSIS — D2372 Other benign neoplasm of skin of left lower limb, including hip: Secondary | ICD-10-CM | POA: Diagnosis not present

## 2022-07-02 NOTE — Progress Notes (Signed)
He presents today chief complaint of a painful area beneath the fifth metatarsal head of the left foot.  States that has been tender for quite some time.  Objective: Vital signs stable he is alert oriented x 3 pulses are palpable.  There is no erythema edema salines drainage or odor he has a loss of fat pad with atrophy in this area he also has some benign skin lesion primarily beneath the fifth metatarsal head which is very palpable with thin skin.  I exquisitely tender there does not appear to be a bursa there at this point.  Assessment: Plantarflexed fifth metatarsal benign skin lesion.  Plan: Debrided benign skin lesion

## 2023-05-29 ENCOUNTER — Ambulatory Visit: Payer: Medicare PPO | Admitting: Dermatology

## 2023-06-17 ENCOUNTER — Ambulatory Visit: Admitting: Podiatry

## 2023-06-17 ENCOUNTER — Ambulatory Visit (INDEPENDENT_AMBULATORY_CARE_PROVIDER_SITE_OTHER)

## 2023-06-17 ENCOUNTER — Encounter: Payer: Self-pay | Admitting: Podiatry

## 2023-06-17 DIAGNOSIS — M7751 Other enthesopathy of right foot: Secondary | ICD-10-CM

## 2023-06-17 MED ORDER — TRIAMCINOLONE ACETONIDE 40 MG/ML IJ SUSP
20.0000 mg | Freq: Once | INTRAMUSCULAR | Status: AC
Start: 2023-06-17 — End: 2023-06-17
  Administered 2023-06-17: 20 mg

## 2023-06-17 NOTE — Progress Notes (Signed)
 He states that about a month ago he started experiencing right ankle pain which responded well to Advil.  He also has an area on the bottom of the foot where pain seems to be radiating.  He also has to shave the small area on the bottom of the foot.  Objective: Vital signs are stable alert and oriented x 3.  Pulses are palpable.  There is no erythema edema cellulitis drainage or odor radiographs do not demonstrate any type of significant osseous pathology in the area of question.  He does have tenderness on end range of motion of the subtalar joint and palpation of the sinus tarsi right foot.  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  Plan: Injected the subtalar joint today after alcohol prep tolerated procedure well.  He was injected with 10 mg of Kenalog and local anesthetic tolerated this well and I will follow-up with him on an as-needed basis.  We did briefly discuss the type of shoe gear that he should wear.

## 2023-07-17 ENCOUNTER — Ambulatory Visit: Admitting: Podiatry

## 2023-12-05 ENCOUNTER — Other Ambulatory Visit: Payer: Self-pay | Admitting: Urology

## 2023-12-05 ENCOUNTER — Encounter: Payer: Self-pay | Admitting: Urology

## 2023-12-05 DIAGNOSIS — R972 Elevated prostate specific antigen [PSA]: Secondary | ICD-10-CM

## 2023-12-05 DIAGNOSIS — R350 Frequency of micturition: Secondary | ICD-10-CM

## 2023-12-05 DIAGNOSIS — N401 Enlarged prostate with lower urinary tract symptoms: Secondary | ICD-10-CM

## 2023-12-23 ENCOUNTER — Inpatient Hospital Stay
Admission: RE | Admit: 2023-12-23 | Discharge: 2023-12-23 | Disposition: A | Source: Ambulatory Visit | Attending: Urology | Admitting: Urology

## 2023-12-23 DIAGNOSIS — R350 Frequency of micturition: Secondary | ICD-10-CM

## 2023-12-23 DIAGNOSIS — N401 Enlarged prostate with lower urinary tract symptoms: Secondary | ICD-10-CM

## 2023-12-23 DIAGNOSIS — R972 Elevated prostate specific antigen [PSA]: Secondary | ICD-10-CM

## 2023-12-23 MED ORDER — GADOPICLENOL 0.5 MMOL/ML IV SOLN
10.0000 mL | Freq: Once | INTRAVENOUS | Status: AC | PRN
  Administered 2023-12-23: 10 mL via INTRAVENOUS
